# Patient Record
Sex: Female | Born: 1945 | Race: White | Hispanic: No | Marital: Married
Health system: Southern US, Community
[De-identification: ages and names within clinical notes are randomized; demographics above are authoritative.]

## PROBLEM LIST (undated history)

## (undated) DIAGNOSIS — E785 Hyperlipidemia, unspecified: Secondary | ICD-10-CM

## (undated) DIAGNOSIS — E119 Type 2 diabetes mellitus without complications: Secondary | ICD-10-CM

## (undated) DIAGNOSIS — E559 Vitamin D deficiency, unspecified: Secondary | ICD-10-CM

## (undated) HISTORY — DX: Vitamin D deficiency, unspecified: E55.9

## (undated) HISTORY — DX: Hyperlipidemia, unspecified: E78.5

## (undated) HISTORY — DX: Type 2 diabetes mellitus without complications: E11.9

---

## 2003-12-01 ENCOUNTER — Emergency Department (HOSPITAL_COMMUNITY): Admission: EM | Admit: 2003-12-01 | Discharge: 2003-12-01 | Payer: Self-pay | Admitting: Emergency Medicine

## 2008-04-10 ENCOUNTER — Other Ambulatory Visit: Admission: RE | Admit: 2008-04-10 | Discharge: 2008-04-10 | Payer: Self-pay | Admitting: Family Medicine

## 2009-05-02 ENCOUNTER — Observation Stay (HOSPITAL_COMMUNITY): Admission: EM | Admit: 2009-05-02 | Discharge: 2009-05-03 | Payer: Self-pay | Admitting: Emergency Medicine

## 2009-05-02 ENCOUNTER — Encounter: Payer: Self-pay | Admitting: Cardiology

## 2009-05-17 ENCOUNTER — Ambulatory Visit (HOSPITAL_COMMUNITY): Admission: RE | Admit: 2009-05-17 | Discharge: 2009-05-17 | Payer: Self-pay | Admitting: Family Medicine

## 2009-05-21 DIAGNOSIS — E785 Hyperlipidemia, unspecified: Secondary | ICD-10-CM

## 2009-05-21 DIAGNOSIS — R079 Chest pain, unspecified: Secondary | ICD-10-CM | POA: Insufficient documentation

## 2009-05-22 ENCOUNTER — Encounter: Admission: RE | Admit: 2009-05-22 | Discharge: 2009-05-22 | Payer: Self-pay | Admitting: Family Medicine

## 2009-05-27 ENCOUNTER — Ambulatory Visit: Payer: Self-pay | Admitting: Cardiology

## 2010-06-11 NOTE — Assessment & Plan Note (Signed)
Summary: np6   History of Present Illness: 65 year old female with no prior cardiac history who I am asked to evaluate for chest pain. She apparently was admitted on May 03, 2009 with atypical chest pain. Her cardiac markers were normal. Liver functions were normal. A d-dimer was negative. She was discharged and asked to followup with cardiology. Since she was discharged she has had no further chest pain. There is no dyspnea on exertion, orthopnea, PND, pedal edema, palpitations, syncope or exertional chest pain. On the day she was admitted she did have substernal chest pain but she states that she was under a significant amount of stress. It was described as a pressure radiating to her neck and left upper extremity. There was no associated symptoms. It lasted for 45 minutes and resolved spontaneously. It was not pleuritic or positional nor is it related to food. Because of her chest pain we were asked to further evaluate.  Current Medications (verified): 1)  Aspirin 81 Mg Tbec (Aspirin) .... Take One Tablet By Mouth Daily 2)  Protonix 40 Mg Solr (Pantoprazole Sodium) .Marland Kitchen.. 1 Tab By Mouth Once Daily 3)  Simvastatin 40 Mg Tabs (Simvastatin) .... Take One Tablet By Mouth Daily At Bedtime 4)  Vitamin D3 1000 Unit Tabs (Cholecalciferol) .Marland Kitchen.. 1 Tab By Mouth Once Daily 5)  Vitamin C 1000 Mg Tabs (Ascorbic Acid) .... 2 Tabs By Mouth Once Daily 6)  Fish Oil   Oil (Fish Oil) .... Tab By Mouth Once Daily  Past History:  Past Medical History: HYPERLIPIDEMIA (ICD-272.4) Asthma  Past Surgical History: Laporoscopy Left knee surgery  Family History: Reviewed history from 05/21/2009 and no changes required. Denied any family history of coronary artery disease,   diabetes, hypertension.  Mother with CVA  Social History: Reviewed history from 05/21/2009 and no changes required.  She lives in [place], she is married.  The   patient denied any tobacco or IV drug use.  She is gainfully   employed and pretty active.  2 to 3 glasses of wine per week  Review of Systems       Some arthritis but no fevers or chills, productive cough, hemoptysis, dysphasia, odynophagia, melena, hematochezia, dysuria, hematuria, rash, seizure activity, orthopnea, PND, pedal edema, claudication. Remaining systems are negative.   Vital Signs:  Patient profile:   65 year old female Height:      66 inches Weight:      174 pounds BMI:     28.19 Pulse rate:   97 / minute Resp:     12 per minute BP sitting:   130 / 78  (left arm)  Vitals Entered By: Kem Parkinson (May 27, 2009 2:54 PM)  Physical Exam  General:  Well developed/well nourished in NAD Skin warm/dry Patient not depressed No peripheral clubbing Back-normal HEENT-normal/normal eyelids Neck supple/normal carotid upstroke bilaterally; no bruits; no JVD; no thyromegaly chest - CTA/ normal expansion CV - RRR/normal S1 and S2; no murmurs, rubs or gallops;  PMI nondisplaced Abdomen -NT/ND, no HSM, no mass, + bowel sounds, no bruit 2+ femoral pulses, no bruits Ext-no edema, chords, 2+ DP Neuro-grossly nonfocal     EKG  Procedure date:  05/02/2009  Findings:      Sinus rhythm at a rate of 74. No ST changes.  Impression & Recommendations:  Problem # 1:  CHEST PAIN (ICD-786.50)  Symptoms with typical description but occurring in a stressful situation. No exertional chest pain since. We'll schedule a stress echocardiogram for risk stratification. If negative no  further cardiac workup. Her updated medication list for this problem includes:    Aspirin 81 Mg Tbec (Aspirin) .Marland Kitchen... Take one tablet by mouth daily  Orders: Stress Echo (Stress Echo)  Problem # 2:  HYPERLIPIDEMIA (ICD-272.4) Continue diet. Followup with her primary care. Her updated medication list for this problem includes:    Simvastatin 40 Mg Tabs (Simvastatin) .Marland Kitchen... Take one tablet by mouth daily at bedtime  Her updated medication list for this problem  includes:    Simvastatin 40 Mg Tabs (Simvastatin) .Marland Kitchen... Take one tablet by mouth daily at bedtime  Patient Instructions: 1)  Your physician recommends that you schedule a follow-up appointment in: AS NEEDED PENDING TEST RESULTS 2)  Your physician has requested that you have a stress echocardiogram. For further information please visit [email or web address]  Please follow instruction sheet as given.

## 2010-06-16 ENCOUNTER — Other Ambulatory Visit (HOSPITAL_COMMUNITY): Payer: Self-pay | Admitting: Family Medicine

## 2010-06-16 DIAGNOSIS — Z1231 Encounter for screening mammogram for malignant neoplasm of breast: Secondary | ICD-10-CM

## 2010-06-17 ENCOUNTER — Ambulatory Visit (HOSPITAL_COMMUNITY)
Admission: RE | Admit: 2010-06-17 | Discharge: 2010-06-17 | Disposition: A | Discharge status: Home / Self Care | Payer: BC Managed Care – PPO | Source: Ambulatory Visit | Attending: Family Medicine | Admitting: Family Medicine

## 2010-06-17 DIAGNOSIS — Z1231 Encounter for screening mammogram for malignant neoplasm of breast: Secondary | ICD-10-CM | POA: Insufficient documentation

## 2010-07-08 ENCOUNTER — Other Ambulatory Visit: Payer: Self-pay | Admitting: Family Medicine

## 2010-07-08 ENCOUNTER — Other Ambulatory Visit (HOSPITAL_COMMUNITY)
Admission: RE | Admit: 2010-07-08 | Discharge: 2010-07-08 | Disposition: A | Discharge status: Home / Self Care | Payer: BC Managed Care – PPO | Source: Ambulatory Visit | Attending: Family Medicine | Admitting: Family Medicine

## 2010-07-08 DIAGNOSIS — Z124 Encounter for screening for malignant neoplasm of cervix: Secondary | ICD-10-CM | POA: Insufficient documentation

## 2010-08-11 LAB — CBC
HCT: 40.8 % (ref 36.0–46.0)
Hemoglobin: 13.8 g/dL (ref 12.0–15.0)
MCHC: 33.8 g/dL (ref 30.0–36.0)
MCV: 86 fL (ref 78.0–100.0)
Platelets: 165 10*3/uL (ref 150–400)
RBC: 4.44 MIL/uL (ref 3.87–5.11)
RDW: 14.1 % (ref 11.5–15.5)
WBC: 5.9 10*3/uL (ref 4.0–10.5)

## 2010-08-11 LAB — COMPREHENSIVE METABOLIC PANEL
AST: 30 U/L (ref 0–37)
BUN: 12 mg/dL (ref 6–23)
CO2: 25 mEq/L (ref 19–32)
Chloride: 110 mEq/L (ref 96–112)
Creatinine, Ser: 0.8 mg/dL (ref 0.4–1.2)
GFR calc Af Amer: >60 mL/min (ref >60)
GFR calc non Af Amer: >60 mL/min (ref >60)
Total Bilirubin: 0.7 mg/dL (ref 0.3–1.2)

## 2010-08-11 LAB — TSH: TSH: 1.882 u[IU]/mL (ref 0.350–4.500)

## 2010-08-11 LAB — BASIC METABOLIC PANEL
CO2: 25 mEq/L (ref 19–32)
Calcium: 9.4 mg/dL (ref 8.4–10.5)
Creatinine, Ser: 0.6 mg/dL (ref 0.4–1.2)

## 2010-08-11 LAB — POCT CARDIAC MARKERS
Myoglobin, poc: 64 ng/mL (ref 12–200)
Troponin i, poc: <0.05 ng/mL (ref 0.00–0.09)

## 2010-08-11 LAB — DIFFERENTIAL
Basophils Relative: 1 % (ref 0–1)
Eosinophils Absolute: 0.2 10*3/uL (ref 0.0–0.7)
Eosinophils Absolute: 0.2 10*3/uL (ref 0.0–0.7)
Eosinophils Relative: 4 % (ref 0–5)
Lymphs Abs: 1.7 10*3/uL (ref 0.7–4.0)
Lymphs Abs: 2.6 10*3/uL (ref 0.7–4.0)
Monocytes Relative: 9 % (ref 3–12)
Neutro Abs: 3.5 10*3/uL (ref 1.7–7.7)
Neutrophils Relative %: 59 % (ref 43–77)

## 2010-08-11 LAB — CARDIAC PANEL(CRET KIN+CKTOT+MB+TROPI)
CK, MB: 0.8 ng/mL (ref 0.3–4.0)
Relative Index: INVALID (ref 0.0–2.5)
Total CK: 55 U/L (ref 7–177)

## 2010-08-11 LAB — LIPID PANEL
HDL: 42 mg/dL (ref >39)
Triglycerides: 166 mg/dL — ABNORMAL HIGH (ref <150)

## 2010-08-11 LAB — D-DIMER, QUANTITATIVE: D-Dimer, Quant: 0.29 ug/mL-FEU (ref 0.00–0.48)

## 2011-10-27 ENCOUNTER — Other Ambulatory Visit (HOSPITAL_COMMUNITY): Payer: Self-pay | Admitting: Family Medicine

## 2011-10-27 DIAGNOSIS — Z1231 Encounter for screening mammogram for malignant neoplasm of breast: Secondary | ICD-10-CM

## 2011-11-19 ENCOUNTER — Ambulatory Visit (HOSPITAL_COMMUNITY)
Admission: RE | Admit: 2011-11-19 | Discharge: 2011-11-19 | Disposition: A | Discharge status: Home / Self Care | Payer: Medicare Other | Source: Ambulatory Visit | Attending: Family Medicine | Admitting: Family Medicine

## 2011-11-19 DIAGNOSIS — Z1231 Encounter for screening mammogram for malignant neoplasm of breast: Secondary | ICD-10-CM | POA: Insufficient documentation

## 2011-11-24 ENCOUNTER — Other Ambulatory Visit: Payer: Self-pay | Admitting: Family Medicine

## 2011-11-24 DIAGNOSIS — R928 Other abnormal and inconclusive findings on diagnostic imaging of breast: Secondary | ICD-10-CM

## 2011-11-30 ENCOUNTER — Ambulatory Visit
Admission: RE | Admit: 2011-11-30 | Discharge: 2011-11-30 | Disposition: A | Discharge status: Home / Self Care | Payer: Medicare Other | Source: Ambulatory Visit | Attending: Family Medicine | Admitting: Family Medicine

## 2011-11-30 ENCOUNTER — Other Ambulatory Visit: Payer: Self-pay | Admitting: Family Medicine

## 2011-11-30 DIAGNOSIS — R928 Other abnormal and inconclusive findings on diagnostic imaging of breast: Secondary | ICD-10-CM

## 2011-12-01 ENCOUNTER — Ambulatory Visit
Admission: RE | Admit: 2011-12-01 | Discharge: 2011-12-01 | Disposition: A | Discharge status: Home / Self Care | Payer: Medicare Other | Source: Ambulatory Visit | Attending: Family Medicine | Admitting: Family Medicine

## 2011-12-01 DIAGNOSIS — R928 Other abnormal and inconclusive findings on diagnostic imaging of breast: Secondary | ICD-10-CM

## 2013-11-21 ENCOUNTER — Ambulatory Visit
Admission: RE | Admit: 2013-11-21 | Discharge: 2013-11-21 | Disposition: A | Discharge status: Home / Self Care | Payer: Medicare Other | Source: Ambulatory Visit | Attending: Family Medicine | Admitting: Family Medicine

## 2013-11-21 ENCOUNTER — Other Ambulatory Visit: Payer: Self-pay | Admitting: Family Medicine

## 2013-11-21 DIAGNOSIS — M79672 Pain in left foot: Secondary | ICD-10-CM

## 2013-11-21 DIAGNOSIS — M25572 Pain in left ankle and joints of left foot: Secondary | ICD-10-CM

## 2014-08-01 ENCOUNTER — Other Ambulatory Visit: Payer: Self-pay | Admitting: Dermatology

## 2014-12-04 IMAGING — CR DG ANKLE COMPLETE 3+V*L*
3 series · 3 of 3 positions shown · non-contrast
Comparison: None.

CLINICAL DATA: Twisted foot and ankle with swelling and bruising

EXAM:
LEFT ANKLE COMPLETE - 3+ VIEW

[view not recorded (1 of 3)]
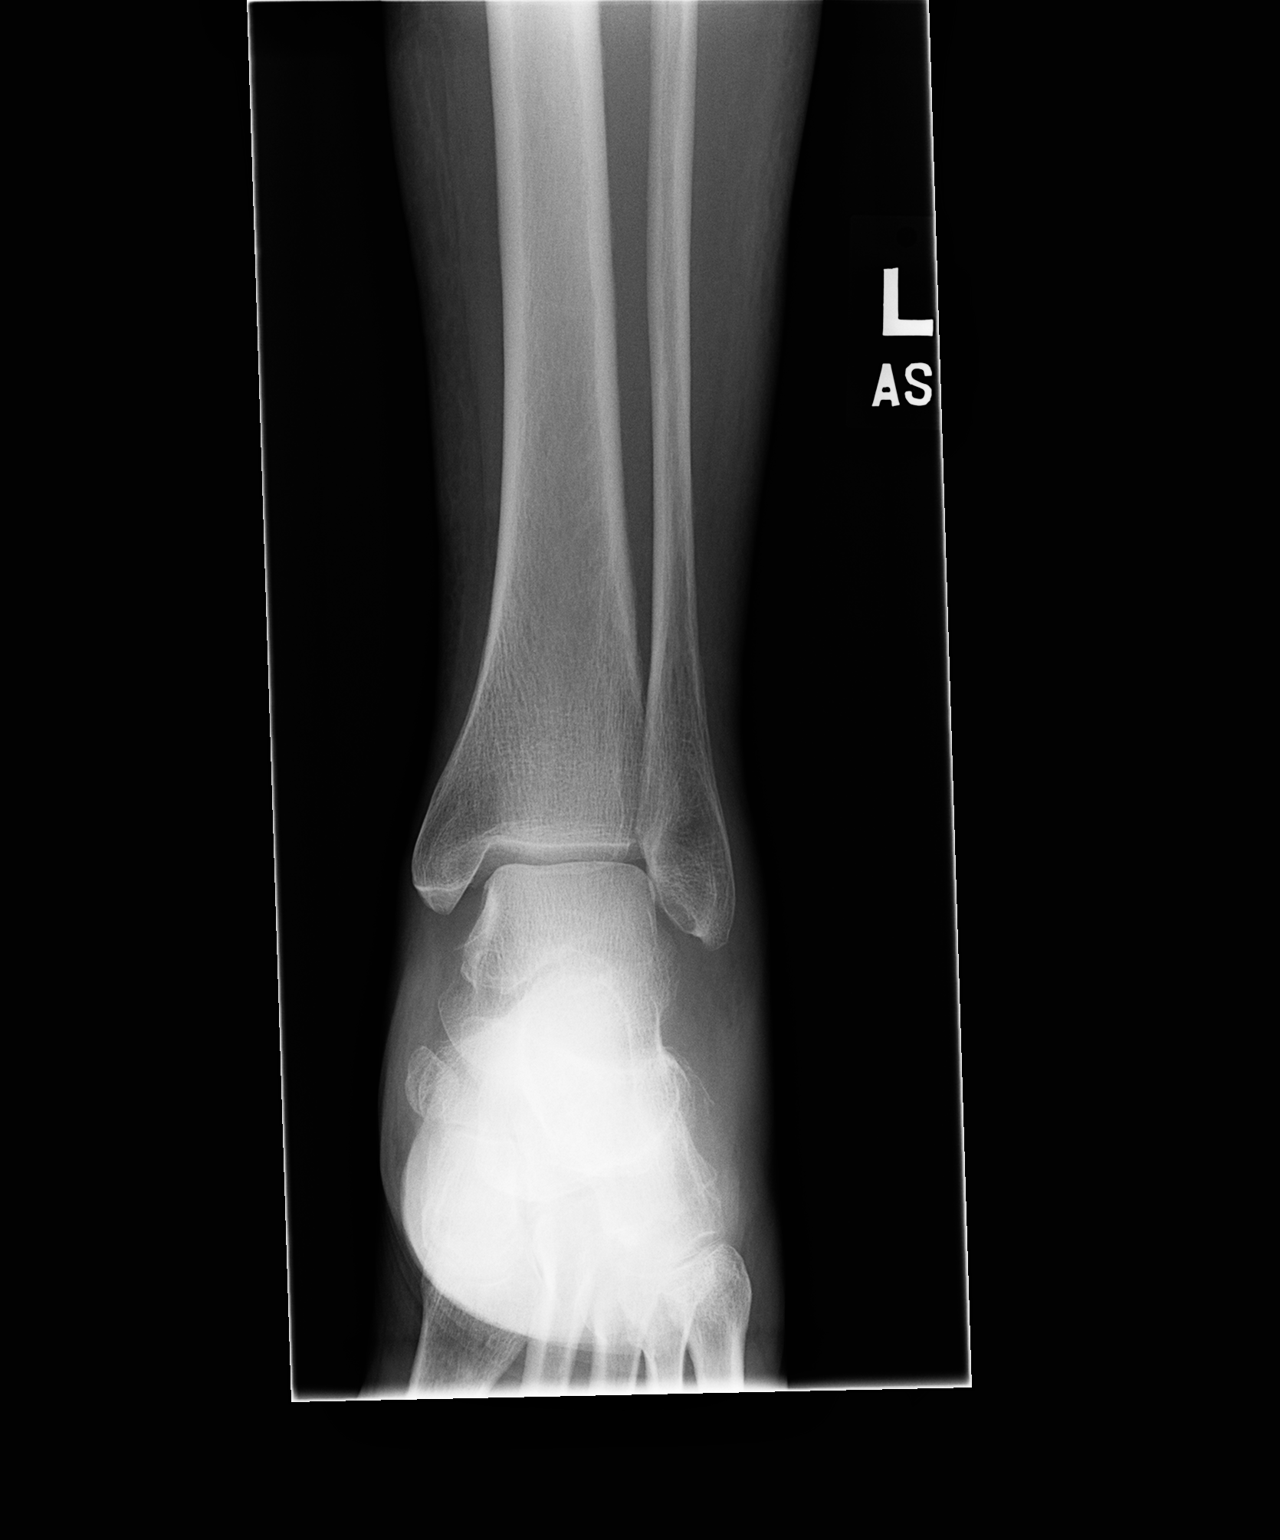

[view not recorded (2 of 3)]
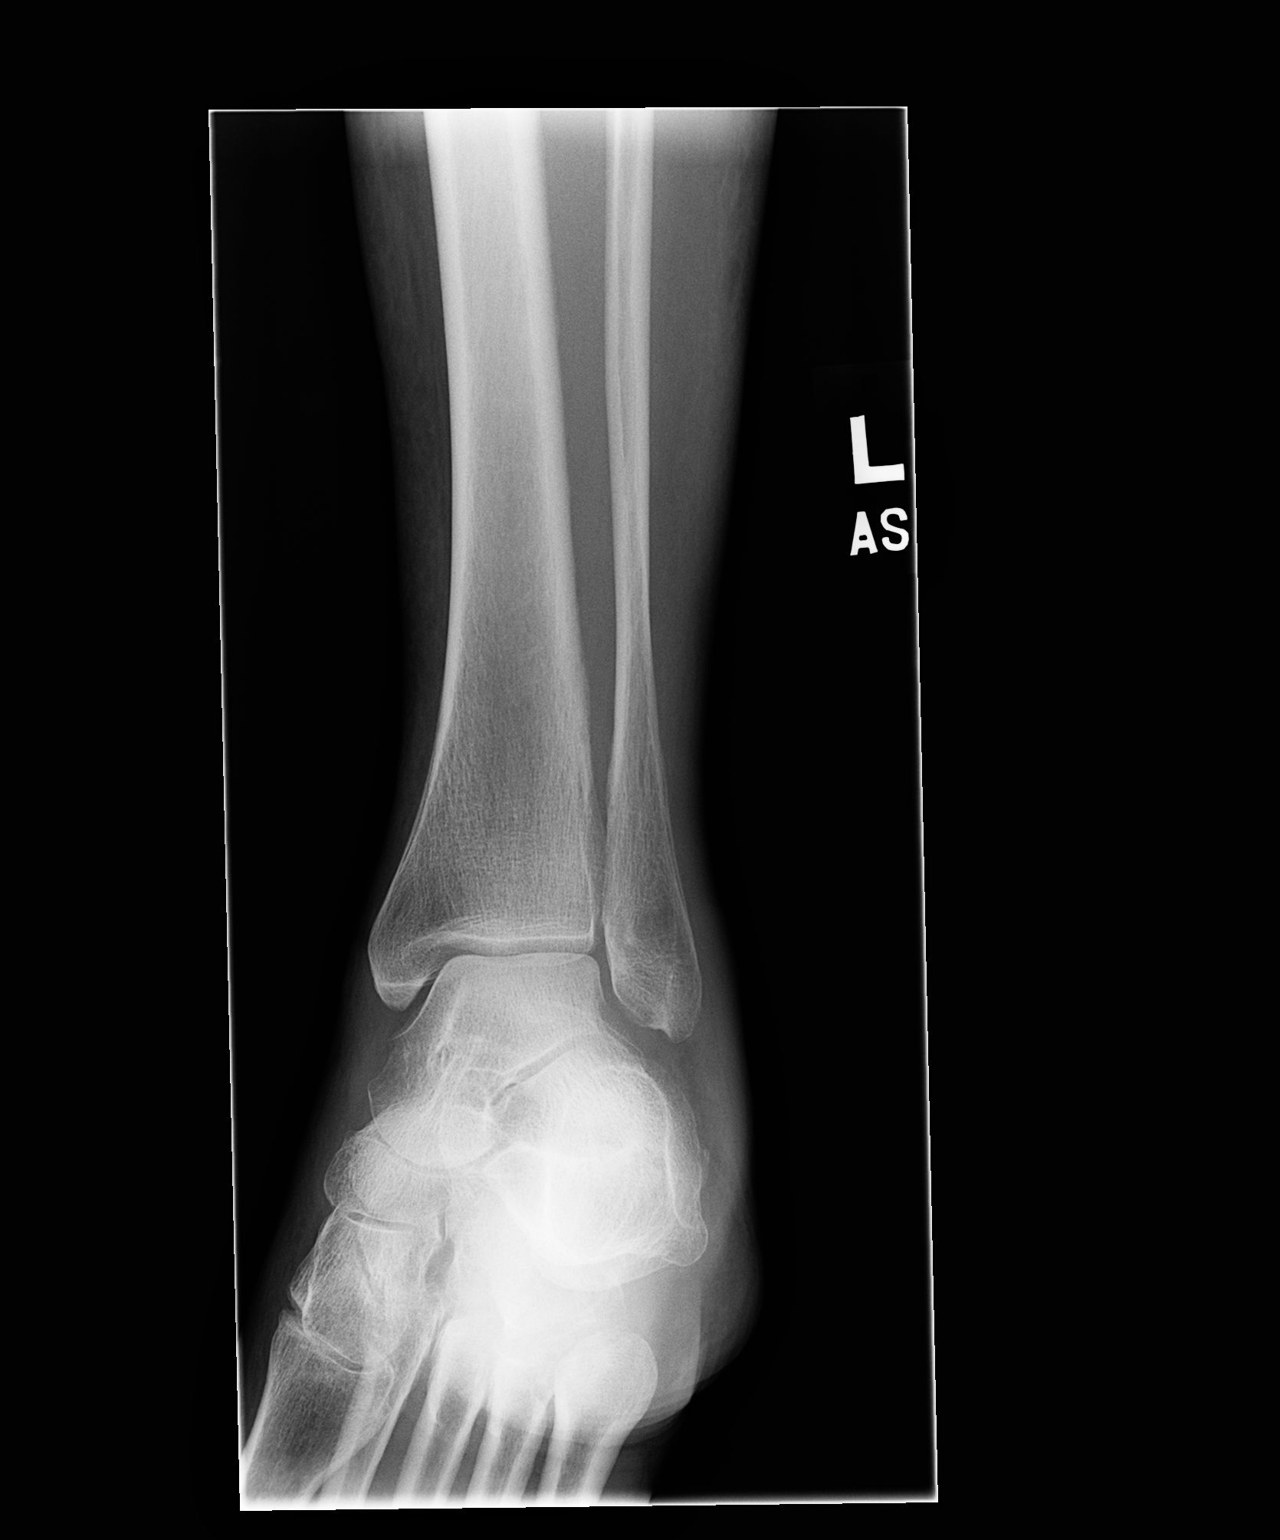

[view not recorded (3 of 3)]
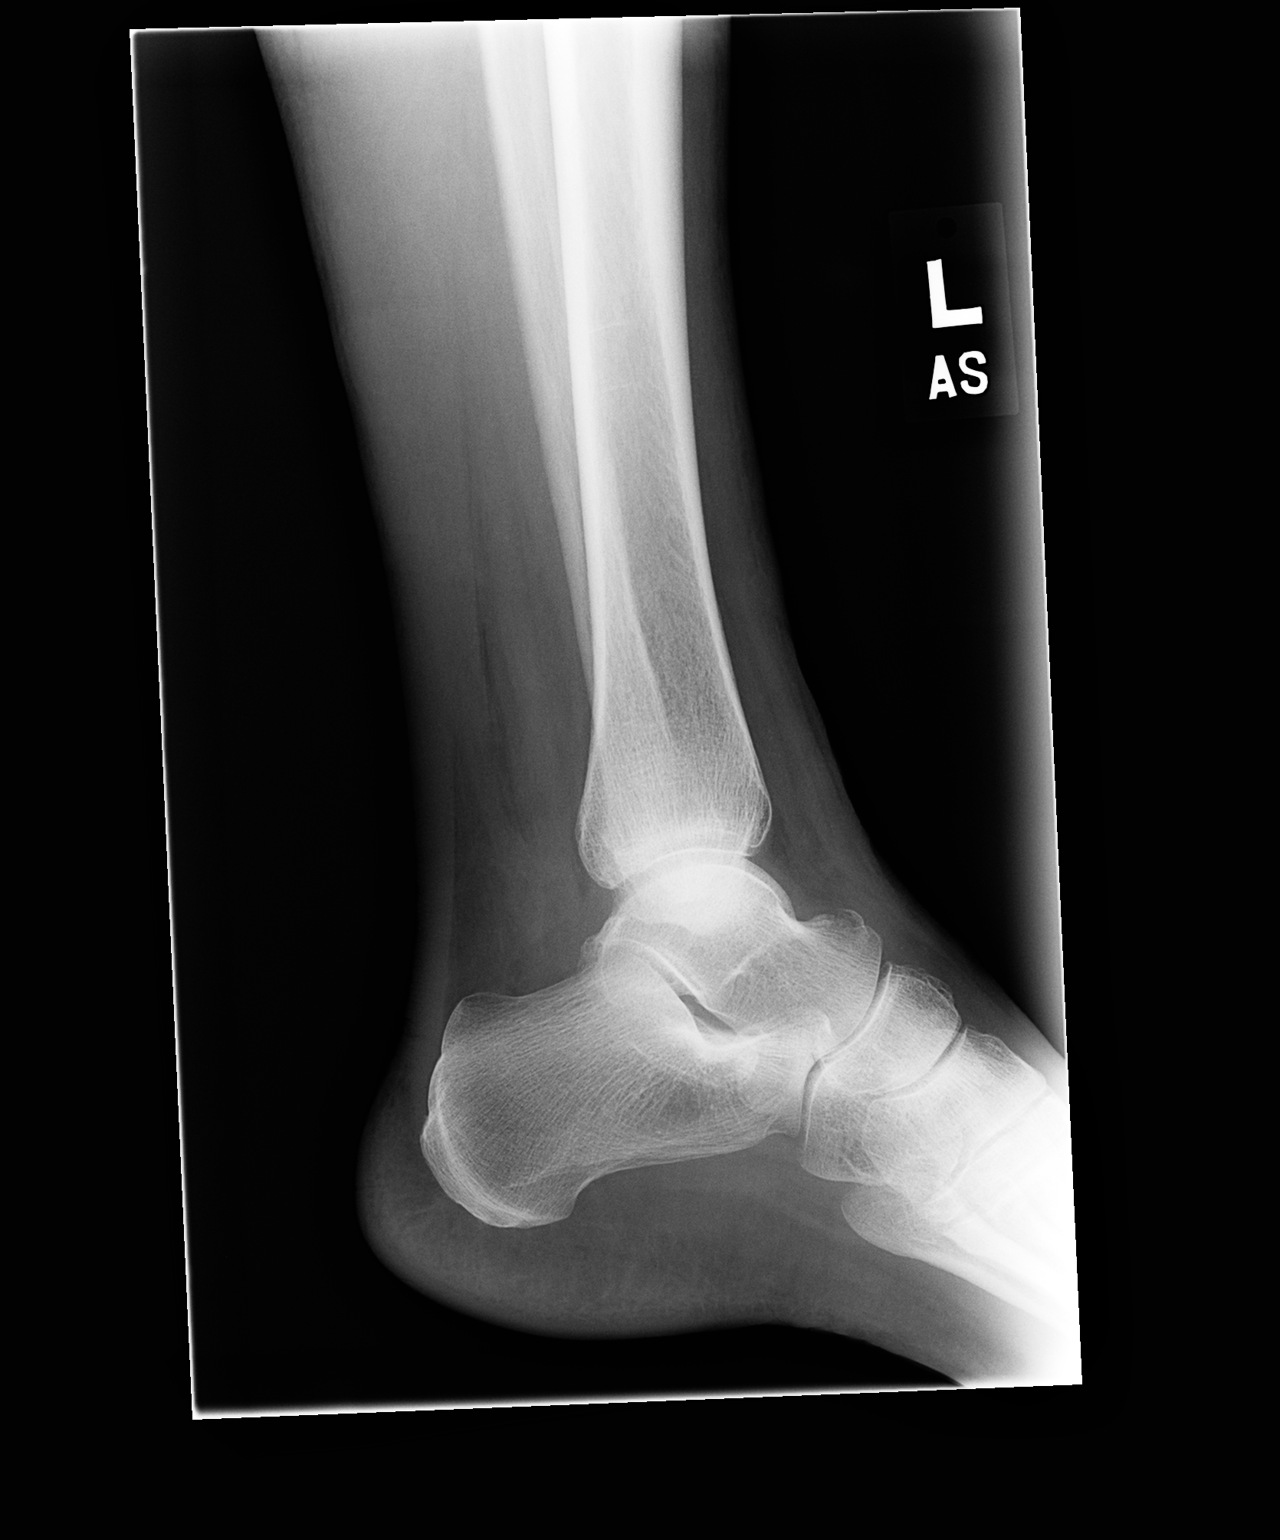

[3 of 3 positions shown; findings below may reference images not displayed]

FINDINGS: There are small avulsion fracture fragments from the talar beak
laterally with adjacent soft tissue swelling. No other acute
abnormality is seen. The ankle joint appears normal and alignment is
normal.
IMPRESSION: Small avulsion fracture fragments from the talar beak.

## 2014-12-28 ENCOUNTER — Other Ambulatory Visit: Payer: Self-pay | Admitting: Family Medicine

## 2014-12-28 ENCOUNTER — Other Ambulatory Visit: Payer: Self-pay

## 2014-12-28 ENCOUNTER — Other Ambulatory Visit (HOSPITAL_COMMUNITY)
Admission: RE | Admit: 2014-12-28 | Discharge: 2014-12-28 | Disposition: A | Discharge status: Home / Self Care | Payer: Medicare Other | Source: Ambulatory Visit | Attending: Family Medicine | Admitting: Family Medicine

## 2014-12-28 DIAGNOSIS — Z124 Encounter for screening for malignant neoplasm of cervix: Secondary | ICD-10-CM | POA: Diagnosis present

## 2014-12-28 DIAGNOSIS — Z1231 Encounter for screening mammogram for malignant neoplasm of breast: Secondary | ICD-10-CM

## 2014-12-31 LAB — CYTOLOGY - PAP

## 2015-02-01 ENCOUNTER — Ambulatory Visit
Admission: RE | Admit: 2015-02-01 | Discharge: 2015-02-01 | Disposition: A | Discharge status: Home / Self Care | Payer: Medicare Other | Source: Ambulatory Visit

## 2015-02-01 DIAGNOSIS — Z1231 Encounter for screening mammogram for malignant neoplasm of breast: Secondary | ICD-10-CM

## 2016-12-08 ENCOUNTER — Encounter: Payer: Self-pay | Admitting: Dietician

## 2016-12-08 ENCOUNTER — Encounter: Payer: Medicare Other | Attending: Family Medicine | Admitting: Dietician

## 2016-12-08 DIAGNOSIS — E119 Type 2 diabetes mellitus without complications: Secondary | ICD-10-CM | POA: Diagnosis not present

## 2016-12-08 DIAGNOSIS — Z713 Dietary counseling and surveillance: Secondary | ICD-10-CM | POA: Insufficient documentation

## 2016-12-08 NOTE — Progress Notes (Signed)
Patient was seen on 12/08/16 for the first of a series of three diabetes self-management courses at the [facility].  Patient Education Plan per assessed needs and concerns is to attend four course education program for Diabetes Self Management Education.  The following learning objectives were met by the patient during this class:  Describe diabetes  State some common risk factors for diabetes  Defines the role of glucose and insulin  Identifies type of diabetes and pathophysiology  Describe the relationship between diabetes and cardiovascular risk  State the members of the Healthcare Team  States the rationale for glucose monitoring  State when to test glucose  State their individual Target Range  State the importance of logging glucose readings  Describe how to interpret glucose readings  Identifies A1C target  Explain the correlation between A1c and eAG values  State symptoms and treatment of high blood glucose  State symptoms and treatment of low blood glucose  Explain proper technique for glucose testing  Identifies proper sharps disposal  Handouts given during class include:  Living Well with Diabetes book  Carb Counting and Meal Planning book  Meal Plan Card  Carbohydrate guide  Meal planning worksheet  Low Sodium Flavoring Tips  The diabetes portion plate  B3X to eAG Conversion Chart  Diabetes Medications  Diabetes Recommended Care Schedule  Support Group  Diabetes Success Plan  Core Class Satisfaction Survey   . The patient's Newest Vital Sign Health Literacy Assessment score was 6. . The patient scored 56% on the Diabetes Knowledge pre-test. . Educational strategies utilized during class included repetition, teach-back, eliminating medical jargon, and being open to questions.   Follow-Up Plan:  Attend core 2

## 2016-12-15 ENCOUNTER — Encounter: Payer: Medicare Other | Attending: Family Medicine | Admitting: Dietician

## 2016-12-15 DIAGNOSIS — Z713 Dietary counseling and surveillance: Secondary | ICD-10-CM | POA: Diagnosis not present

## 2016-12-15 DIAGNOSIS — E119 Type 2 diabetes mellitus without complications: Secondary | ICD-10-CM | POA: Insufficient documentation

## 2016-12-15 NOTE — Progress Notes (Signed)
Patient was seen on 12/15/16 for the third of a series of three diabetes self-management courses at the [facility].   Janene Madeira. State the amount of activity recommended for healthy living . Describe activities suitable for individual needs . Identify ways to regularly incorporate activity into daily life . Identify barriers to activity and ways to over come these barriers  Identify diabetes medications being personally used and their primary action for lowering glucose and possible side effects . Describe role of stress on blood glucose and develop strategies to address psychosocial issues . Identify diabetes complications and ways to prevent them  Explain how to manage diabetes during illness . Evaluate success in meeting personal goal . Establish 2-3 goals that they will plan to diligently work on until they return for the  2155-month follow-up visit  Goals:   I will count my carb choices at most meals and snacks and cut serving sizes by 50% to closer approximate recommended sizes (especially popcorn) as well as increase veggie mixture with rice and add kale or other greens  I will be active 30 minutes or more 3 times a week  I will eat less unhealthy fats by eating less prepared foods and meat  To help manage stress I will  walking at least 3 times a week  Your patient has identified these potential barriers to change:  Motivation Stress of time constraints  Your patient has identified their diabetes self-care support plan as  Family Support (Her son works for the [employer]  . The patient scored 67% on the Diabetes Knowledge post-test.   Plan:  Attend Support Group as desired

## 2016-12-15 NOTE — Progress Notes (Signed)
Patient was seen on 12/15/16 for the second of a series of three diabetes self-management courses at the [facility]. The following learning objectives were met by the patient during this class:   Describe the role of different macronutrients on glucose  Explain how carbohydrates affect blood glucose  State what foods contain the most carbohydrates  Demonstrate carbohydrate counting  Demonstrate how to read Nutrition Facts food label  Describe effects of various fats on heart health  Describe the importance of good nutrition for health and healthy eating strategies  Describe techniques for managing your shopping, cooking and meal planning  List strategies to follow meal plan when dining out  Describe the effects of alcohol on glucose and how to use it safely  Goals:  Follow Diabetes Meal Plan as instructed  Aim to spread carbs evenly throughout the day  Aim for 3 meals per day and snacks as needed Include lean protein foods to meals/snacks  Monitor glucose levels as instructed by your doctor   Follow-Up Plan:  Attend Core 3  Work towards following your personal food plan.
